# Patient Record
Sex: Female | Born: 1985 | Race: White | Hispanic: No | Marital: Single | Smoking: Never smoker
Health system: Southern US, Community
[De-identification: ages and names within clinical notes are randomized; demographics above are authoritative.]

---

## 2011-08-09 ENCOUNTER — Encounter (HOSPITAL_BASED_OUTPATIENT_CLINIC_OR_DEPARTMENT_OTHER): Payer: Self-pay

## 2011-08-09 ENCOUNTER — Other Ambulatory Visit: Payer: Self-pay

## 2011-08-09 ENCOUNTER — Emergency Department (INDEPENDENT_AMBULATORY_CARE_PROVIDER_SITE_OTHER): Payer: 59

## 2011-08-09 ENCOUNTER — Emergency Department (HOSPITAL_BASED_OUTPATIENT_CLINIC_OR_DEPARTMENT_OTHER)
Admission: EM | Admit: 2011-08-09 | Discharge: 2011-08-09 | Disposition: A | Discharge status: Home / Self Care | Payer: 59 | Attending: Emergency Medicine | Admitting: Emergency Medicine

## 2011-08-09 DIAGNOSIS — R071 Chest pain on breathing: Secondary | ICD-10-CM | POA: Insufficient documentation

## 2011-08-09 DIAGNOSIS — R079 Chest pain, unspecified: Secondary | ICD-10-CM | POA: Insufficient documentation

## 2011-08-09 DIAGNOSIS — R0789 Other chest pain: Secondary | ICD-10-CM

## 2011-08-09 LAB — CBC
MCV: 76.5 fL — ABNORMAL LOW (ref 78.0–100.0)
Platelets: 318 10*3/uL (ref 150–400)
RBC: 4.55 MIL/uL (ref 3.87–5.11)
RDW: 13.5 % (ref 11.5–15.5)
WBC: 4.9 10*3/uL (ref 4.0–10.5)

## 2011-08-09 LAB — BASIC METABOLIC PANEL
CO2: 23 mEq/L (ref 19–32)
Chloride: 104 mEq/L (ref 96–112)
Creatinine, Ser: 0.7 mg/dL (ref 0.50–1.10)
GFR calc Af Amer: >90 mL/min (ref >90)
Potassium: 3.9 mEq/L (ref 3.5–5.1)
Sodium: 138 mEq/L (ref 135–145)

## 2011-08-09 LAB — CARDIAC PANEL(CRET KIN+CKTOT+MB+TROPI)
CK, MB: 3 ng/mL (ref 0.3–4.0)
Relative Index: 0.8 (ref 0.0–2.5)
Total CK: 396 U/L — ABNORMAL HIGH (ref 7–177)

## 2011-08-09 LAB — D-DIMER, QUANTITATIVE: D-Dimer, Quant: <0.22 ug/mL-FEU (ref 0.00–0.48)

## 2011-08-09 MED ORDER — IBUPROFEN 800 MG PO TABS: 800.0000 mg | ORAL_TABLET | Freq: Three times a day (TID) | ORAL | Status: AC

## 2011-08-09 MED ORDER — IBUPROFEN 800 MG PO TABS
800.0000 mg | ORAL_TABLET | Freq: Once | ORAL | Status: AC
  Administered 2011-08-09: 800 mg via ORAL
  Filled 2011-08-09: qty 1

## 2011-08-09 NOTE — ED Provider Notes (Signed)
History     CSN: [id number]  Arrival date & time 08/09/11  2036   First MD Initiated Contact with Patient 08/09/11 2125      Chief Complaint  Patient presents with  . Arm Pain    (Consider location/radiation/quality/duration/timing/severity/associated sxs/prior treatment) Patient is a 26 y.o. female presenting with chest pain. The history is provided by the patient. No language interpreter was used.  Chest Pain The chest pain began 3 - 5 hours ago. Chest pain occurs constantly. The chest pain is improving. The pain is associated with exertion. At its most intense, the pain is at 8/10. The pain is currently at 2/10. The severity of the pain is moderate. The quality of the pain is described as aching. The pain radiates to the left arm. Chest pain is worsened by certain positions and exertion. Pertinent negatives for primary symptoms include no fever, no fatigue, no syncope, no shortness of breath, no cough, no wheezing and no nausea.  Pertinent negatives for associated symptoms include no claudication, no lower extremity edema and no numbness. She tried nothing for the symptoms. Risk factors include obesity.  Pertinent negatives for past medical history include no CAD, no hyperlipidemia and no hypertension.  Her family medical history is significant for heart disease in family.   Pt reports she did a different exercise earlier in the week.  Pt reports she was shopping today and began having pain  History reviewed. No pertinent past medical history.  History reviewed. No pertinent past surgical history.  History reviewed. No pertinent family history.  History  Substance Use Topics  . Smoking status: Never Smoker   . Smokeless tobacco: Never Used  . Alcohol Use: Yes     socially    OB History    Grav Para Term Preterm Abortions TAB SAB Ect Mult Living                  Review of Systems  Unable to perform ROS Constitutional: Negative for fever and fatigue.  Respiratory:  Negative for cough, shortness of breath and wheezing.   Cardiovascular: Positive for chest pain. Negative for claudication and syncope.  Gastrointestinal: Negative for nausea.  Neurological: Negative for numbness.  All other systems reviewed and are negative.    Allergies  Review of patient's allergies indicates no known allergies.  Home Medications   Current Outpatient Rx  Name Route Sig Dispense Refill  . ASPIRIN-ACETAMINOPHEN-CAFFEINE 250-250-65 MG PO TABS Oral Take 2 tablets by mouth once as needed. For headache    . ADULT MULTIVITAMIN W/MINERALS CH Oral Take 2 tablets by mouth daily.    Marland Kitchen OVER THE COUNTER MEDICATION Oral Take 1 tablet by mouth 3 (three) times daily. 150 mg raspberry ketone supplement      BP 136/92  Pulse 77  Temp(Src) 97.8 F (36.6 C) (Oral)  Resp 16  Ht 5\' 7"  (1.702 m)  Wt 330 lb (149.687 kg)  BMI 51.69 kg/m2  SpO2 100%  LMP 07/13/2011  Physical Exam  Nursing note and vitals reviewed. Constitutional: She appears well-developed and well-nourished.  HENT:  Head: Normocephalic and atraumatic.  Nose: Nose normal.  Mouth/Throat: Oropharynx is clear and moist.  Eyes: Pupils are equal, round, and reactive to light.  Neck: Normal range of motion.  Cardiovascular: Normal rate and normal heart sounds.   Pulmonary/Chest: Effort normal and breath sounds normal.       Pain to palp left chest.  Pt able to isolate pain to left breast tissue.  Abdominal: Soft.  Musculoskeletal: Normal range of motion.  Neurological: She is alert.  Skin: Skin is warm.  Psychiatric: She has a normal mood and affect.    ED Course  Procedures (including critical care time)  Labs Reviewed - No data to display No results found.   No diagnosis found.    MDM   Date: 08/09/2011  Rate: 72  Rhythm: normal sinus rhythm  QRS Axis: normal  Intervals: normal  ST/T Wave abnormalities: normal  Conduction Disutrbances:none  Narrative Interpretation:   Old EKG Reviewed: none  available  Results for orders placed during the hospital encounter of 08/09/11  CBC      Component Value Range   WBC 4.9  4.0 - 10.5 (K/uL)   RBC 4.55  3.87 - 5.11 (MIL/uL)   Hemoglobin 11.7 (*) 12.0 - 15.0 (g/dL)   HCT 16.1 (*) 09.6 - 46.0 (%)   MCV 76.5 (*) 78.0 - 100.0 (fL)   MCH 25.7 (*) 26.0 - 34.0 (pg)   MCHC 33.6  30.0 - 36.0 (g/dL)   RDW 04.5  40.9 - 81.1 (%)   Platelets 318  150 - 400 (K/uL)  BASIC METABOLIC PANEL      Component Value Range   Sodium 138  135 - 145 (mEq/L)   Potassium 3.9  3.5 - 5.1 (mEq/L)   Chloride 104  96 - 112 (mEq/L)   CO2 23  19 - 32 (mEq/L)   Glucose, Bld 109 (*) 70 - 99 (mg/dL)   BUN 15  6 - 23 (mg/dL)   Creatinine, Ser 9.14  0.50 - 1.10 (mg/dL)   Calcium 9.7  8.4 - 78.2 (mg/dL)   GFR calc non Af Amer >90  >90 (mL/min)   GFR calc Af Amer >90  >90 (mL/min)  CARDIAC PANEL(CRET KIN+CKTOT+MB+TROPI)      Component Value Range   Total CK 396 (*) 7 - 177 (U/L)   CK, MB 3.0  0.3 - 4.0 (ng/mL)   Troponin I <0.30  <0.30 (ng/mL)   Relative Index 0.8  0.0 - 2.5   D-DIMER, QUANTITATIVE      Component Value Range   D-Dimer, Quant <0.22  0.00 - 0.48 (ug/mL-FEU)   Dg Chest 2 View  08/09/2011  *RADIOLOGY REPORT*  Clinical Data: Left-sided chest pain for 3 hours.  CHEST - 2 VIEW  Comparison: None.  Findings: Normal heart size and pulmonary vascularity.  The lungs appear clear expanded.  No focal airspace consolidation.  No blunting of costophrenic angles.  No pneumothorax.  Incidental note of a curvilinear calcification in the right humeral head which probably represents a bone cyst.  IMPRESSION: No evidence of active pulmonary disease.  Original Report Authenticated By: Marlon Pel, M.D.       ED course.  Pt comfortable,   I advised of results.  I think pain is muscular.  I advised see primary Md for recheck tomorrow.  Ibuprofen 800mg  for pain.   Return if symptoms worsen or change.  Pt advised of need for recheck of blood pressure.   Langston Masker,  [place] 08/09/11 2325

## 2011-08-09 NOTE — ED Provider Notes (Signed)
Medical screening examination/treatment/procedure(s) were conducted as a shared visit with non-physician practitioner(s) and myself.  I personally evaluated the patient during the encounter  RRR, no m/r/g. Denies h/o VTE in self but grandfather with VTE unk cause. No recent hosp/surg/immob. No h/o cancer. Denies exogenous hormone use, no leg pain or swelling. D dimer negative here. Min nonspecific diffuse STE on ecg. Possible pericarditis v/s msk. Myocarditis far less likely. Ibuprofen, PMD f/u   Forbes Cellar, MD 08/09/11 2342

## 2011-08-09 NOTE — ED Notes (Signed)
Pt presnts to ED today with left sided chest pain that radiated down left arm.  No neck or jaw pain.  Pt rates on scale 8/10.  Pt had been shopping all day and stated pain started when she got home this pm.  Pt has no hx of same.  Family hx + for cardiac.

## 2011-08-09 NOTE — ED Notes (Signed)
Pt states that she has severe L arm pain radiating from her fingers to her L breast.  Pt denies cardiac history.  Pt states that she has not been nausea, vomiting, diaphoretic.

## 2012-12-07 ENCOUNTER — Other Ambulatory Visit: Payer: Self-pay

## 2012-12-07 ENCOUNTER — Emergency Department (HOSPITAL_BASED_OUTPATIENT_CLINIC_OR_DEPARTMENT_OTHER)
Admission: EM | Admit: 2012-12-07 | Discharge: 2012-12-07 | Disposition: A | Discharge status: Home / Self Care | Payer: BC Managed Care – PPO | Attending: Emergency Medicine | Admitting: Emergency Medicine

## 2012-12-07 ENCOUNTER — Encounter (HOSPITAL_BASED_OUTPATIENT_CLINIC_OR_DEPARTMENT_OTHER): Payer: Self-pay | Admitting: *Deleted

## 2012-12-07 ENCOUNTER — Emergency Department (HOSPITAL_BASED_OUTPATIENT_CLINIC_OR_DEPARTMENT_OTHER): Payer: BC Managed Care – PPO

## 2012-12-07 DIAGNOSIS — R0789 Other chest pain: Secondary | ICD-10-CM | POA: Insufficient documentation

## 2012-12-07 MED ORDER — ALBUTEROL SULFATE HFA 108 (90 BASE) MCG/ACT IN AERS
2.0000 | INHALATION_SPRAY | RESPIRATORY_TRACT | Status: DC | PRN
  Administered 2012-12-07: 2 via RESPIRATORY_TRACT
  Filled 2012-12-07: qty 6.7

## 2012-12-07 NOTE — ED Notes (Signed)
Pt amb to room 10 with quick steady gait in nad. Pt reports intermittent chest pains x Sunday, chest is tender to touch, pain increases with inspiration. Denies any sob, nausea, diaphoresis or other c/o.

## 2012-12-07 NOTE — ED Provider Notes (Signed)
History     CSN: [id number]  Arrival date & time 12/07/12  4098   First MD Initiated Contact with Patient 12/07/12 [phone]      Chief Complaint  Patient presents with  . Pleurisy    (Consider location/radiation/quality/duration/timing/severity/associated sxs/prior treatment) HPI Pt presenting with c/o chest pain and sensation of tightness in her chest.  Symptoms have been ongoing over the past 3 days.  No difficulty breathing.  No sob, no nausea, no diaphoresis.  No leg swelling, no hx of DVT/PE, no recent travel/trauma/surgery.  She has not taken anything for her symptoms.  This morning she felt as if she was having a panic attack but this has resolved.  No nasal congestion or sore throat.  No cough.  There are no other associated systemic symptoms, there are no other alleviating or modifying factors.   History reviewed. No pertinent past medical history.  History reviewed. No pertinent past surgical history.  History reviewed. No pertinent family history.  History  Substance Use Topics  . Smoking status: Never Smoker   . Smokeless tobacco: Never Used  . Alcohol Use: Yes     Comment: socially    OB History   Grav Para Term Preterm Abortions TAB SAB Ect Mult Living                  Review of Systems ROS reviewed and all otherwise negative except for mentioned in HPI  Allergies  Review of patient's allergies indicates no known allergies.  Home Medications   Current Outpatient Rx  Name  Route  Sig  Dispense  Refill  . aspirin-acetaminophen-caffeine (EXCEDRIN MIGRAINE) 250-250-65 MG per tablet   Oral   Take 2 tablets by mouth once as needed. For headache         . Multiple Vitamin (MULITIVITAMIN WITH MINERALS) TABS   Oral   Take 2 tablets by mouth daily.         Marland Kitchen OVER THE COUNTER MEDICATION   Oral   Take 1 tablet by mouth 3 (three) times daily. 150 mg raspberry ketone supplement           BP 136/80  Pulse 79  Temp(Src) 97.9 F (36.6 C) (Oral)  Resp  18  Ht 5\' 7"  (1.702 m)  Wt 303 lb (137.44 kg)  BMI 47.45 kg/m2  SpO2 100%  LMP 11/28/2012 Vitals reviewed Physical Exam Physical Examination: General appearance - alert, well appearing, and in no distress Mental status - alert, oriented to person, place, and time Eyes - no scleral icterus, no conjunctival injection Mouth - mucous membranes moist, pharynx normal without lesions Chest - clear to auscultation, no wheezes, rales or rhonchi, symmetric air entry, no reproducible tenderness to palpation, no increased respiratory effort Heart - normal rate, regular rhythm, normal S1, S2, no murmurs, rubs, clicks or gallops Abdomen - soft, nontender, nondistended, no masses or organomegaly Extremities - peripheral pulses normal, no pedal edema, no clubbing or cyanosis Skin - normal coloration and turgor, no rashes  ED Course  Procedures (including critical care time)   Date: 12/07/2012  Rate: 70  Rhythm: normal sinus rhythm  QRS Axis: normal  Intervals: normal  ST/T Wave abnormalities: normal  Conduction Disutrbances: none  Narrative Interpretation: unremarkable     Labs Reviewed - No data to display Dg Chest 2 View  12/07/2012   *RADIOLOGY REPORT*  Clinical Data: Chest pain  CHEST - 2 VIEW  Comparison: None  Findings: The heart size and mediastinal contours are within normal  limits.  Both lungs are clear.  The visualized skeletal structures are unremarkable.  IMPRESSION: Negative examination.   Original Report Authenticated By: Signa Kell, M.D.     1. Feeling of chest tightness       MDM  Pt presenting with c/o chest tightness.  This morning her symptoms were associated with what she described as a panic attack.  EKG and CXR are normal, her exam is reassuring as well.  Low suspicion for ACS, PERC 0 so very low risk of PE.  Given albuterol MDI which may help with her symptoms, though no frank wheezing appreciated.  Discharged with strict return precautions.  Pt agreeable with  plan.        Ethelda Chick, MD 12/08/12 [phone]

## 2013-01-06 ENCOUNTER — Emergency Department (HOSPITAL_BASED_OUTPATIENT_CLINIC_OR_DEPARTMENT_OTHER)
Admission: EM | Admit: 2013-01-06 | Discharge: 2013-01-06 | Disposition: A | Discharge status: Home / Self Care | Payer: BC Managed Care – PPO | Attending: Emergency Medicine | Admitting: Emergency Medicine

## 2013-01-06 ENCOUNTER — Encounter (HOSPITAL_BASED_OUTPATIENT_CLINIC_OR_DEPARTMENT_OTHER): Payer: Self-pay | Admitting: Emergency Medicine

## 2013-01-06 DIAGNOSIS — Z79899 Other long term (current) drug therapy: Secondary | ICD-10-CM | POA: Insufficient documentation

## 2013-01-06 DIAGNOSIS — S8990XA Unspecified injury of unspecified lower leg, initial encounter: Secondary | ICD-10-CM | POA: Insufficient documentation

## 2013-01-06 DIAGNOSIS — S99929A Unspecified injury of unspecified foot, initial encounter: Secondary | ICD-10-CM | POA: Insufficient documentation

## 2013-01-06 DIAGNOSIS — M25562 Pain in left knee: Secondary | ICD-10-CM

## 2013-01-06 DIAGNOSIS — Y9389 Activity, other specified: Secondary | ICD-10-CM | POA: Insufficient documentation

## 2013-01-06 DIAGNOSIS — Y92410 Unspecified street and highway as the place of occurrence of the external cause: Secondary | ICD-10-CM | POA: Insufficient documentation

## 2013-01-06 DIAGNOSIS — IMO0002 Reserved for concepts with insufficient information to code with codable children: Secondary | ICD-10-CM | POA: Insufficient documentation

## 2013-01-06 MED ORDER — HYDROCODONE-ACETAMINOPHEN 5-325 MG PO TABS: 2.0000 | ORAL_TABLET | Freq: Four times a day (QID) | ORAL | Status: DC | PRN
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
## 2013-01-06 NOTE — ED Notes (Signed)
Pt ambulating independently w/ steady gait on d/c in no acute distress, A&Ox4. D/c instructions reviewed w/ pt and family - pt and family deny any further questions or concerns at present. Rx given x1, Pt instructed to not use alcohol, drive, or operate heavy machinery while take the prescription pain medications as they could make him drowsy - pt verbalized understanding.    

## 2013-01-11 NOTE — ED Provider Notes (Signed)
Medical screening examination/treatment/procedure(s) were performed by non-physician practitioner and as supervising physician I was immediately available for consultation/collaboration.   Carleene Cooper III, MD 01/11/13 [phone]

## 2014-04-06 IMAGING — CR DG CHEST 2V
2 series · 2 of 2 positions shown · non-contrast
Comparison: None

CLINICAL DATA: Chest pain

CHEST - 2 VIEW

[w chest pa]
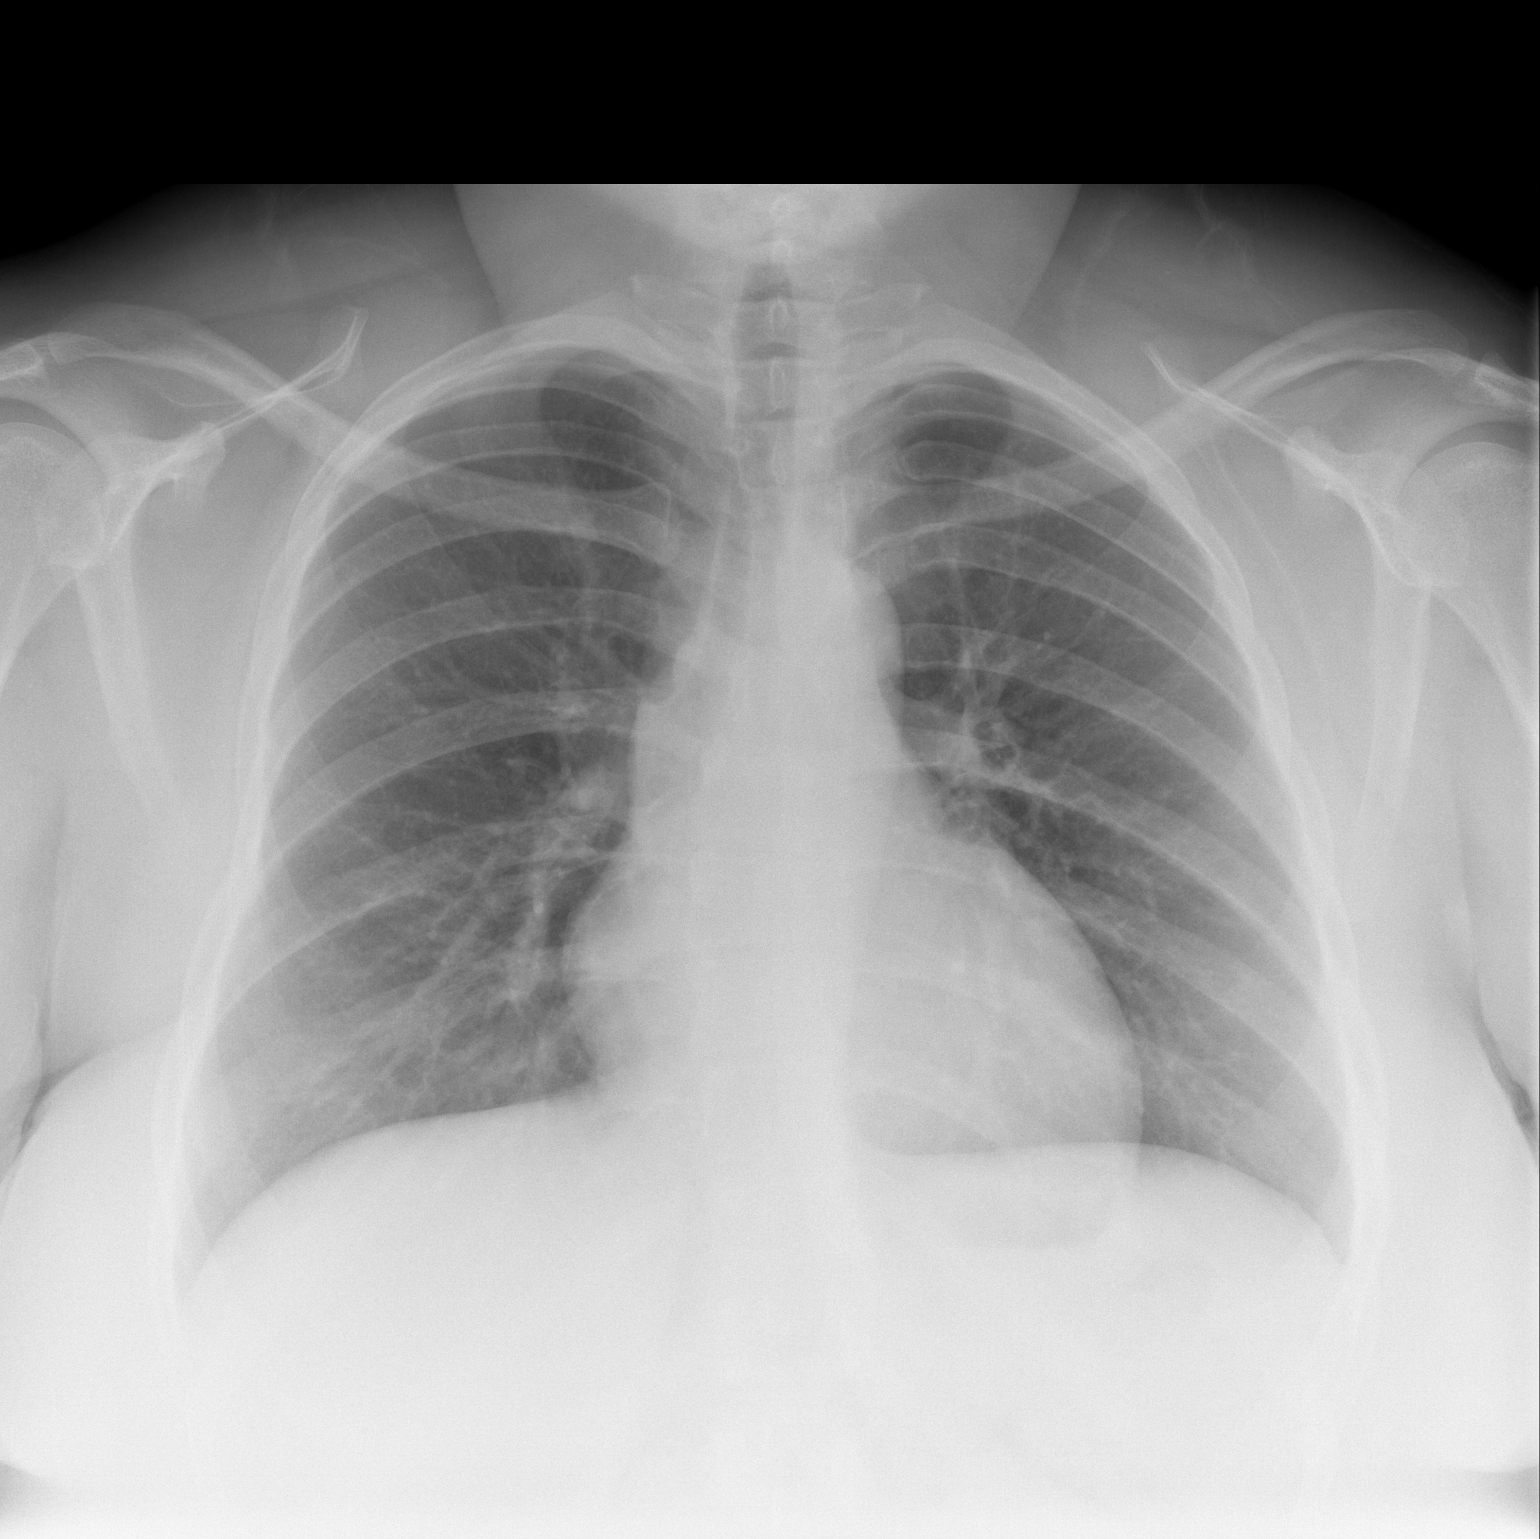

[w chest lat]
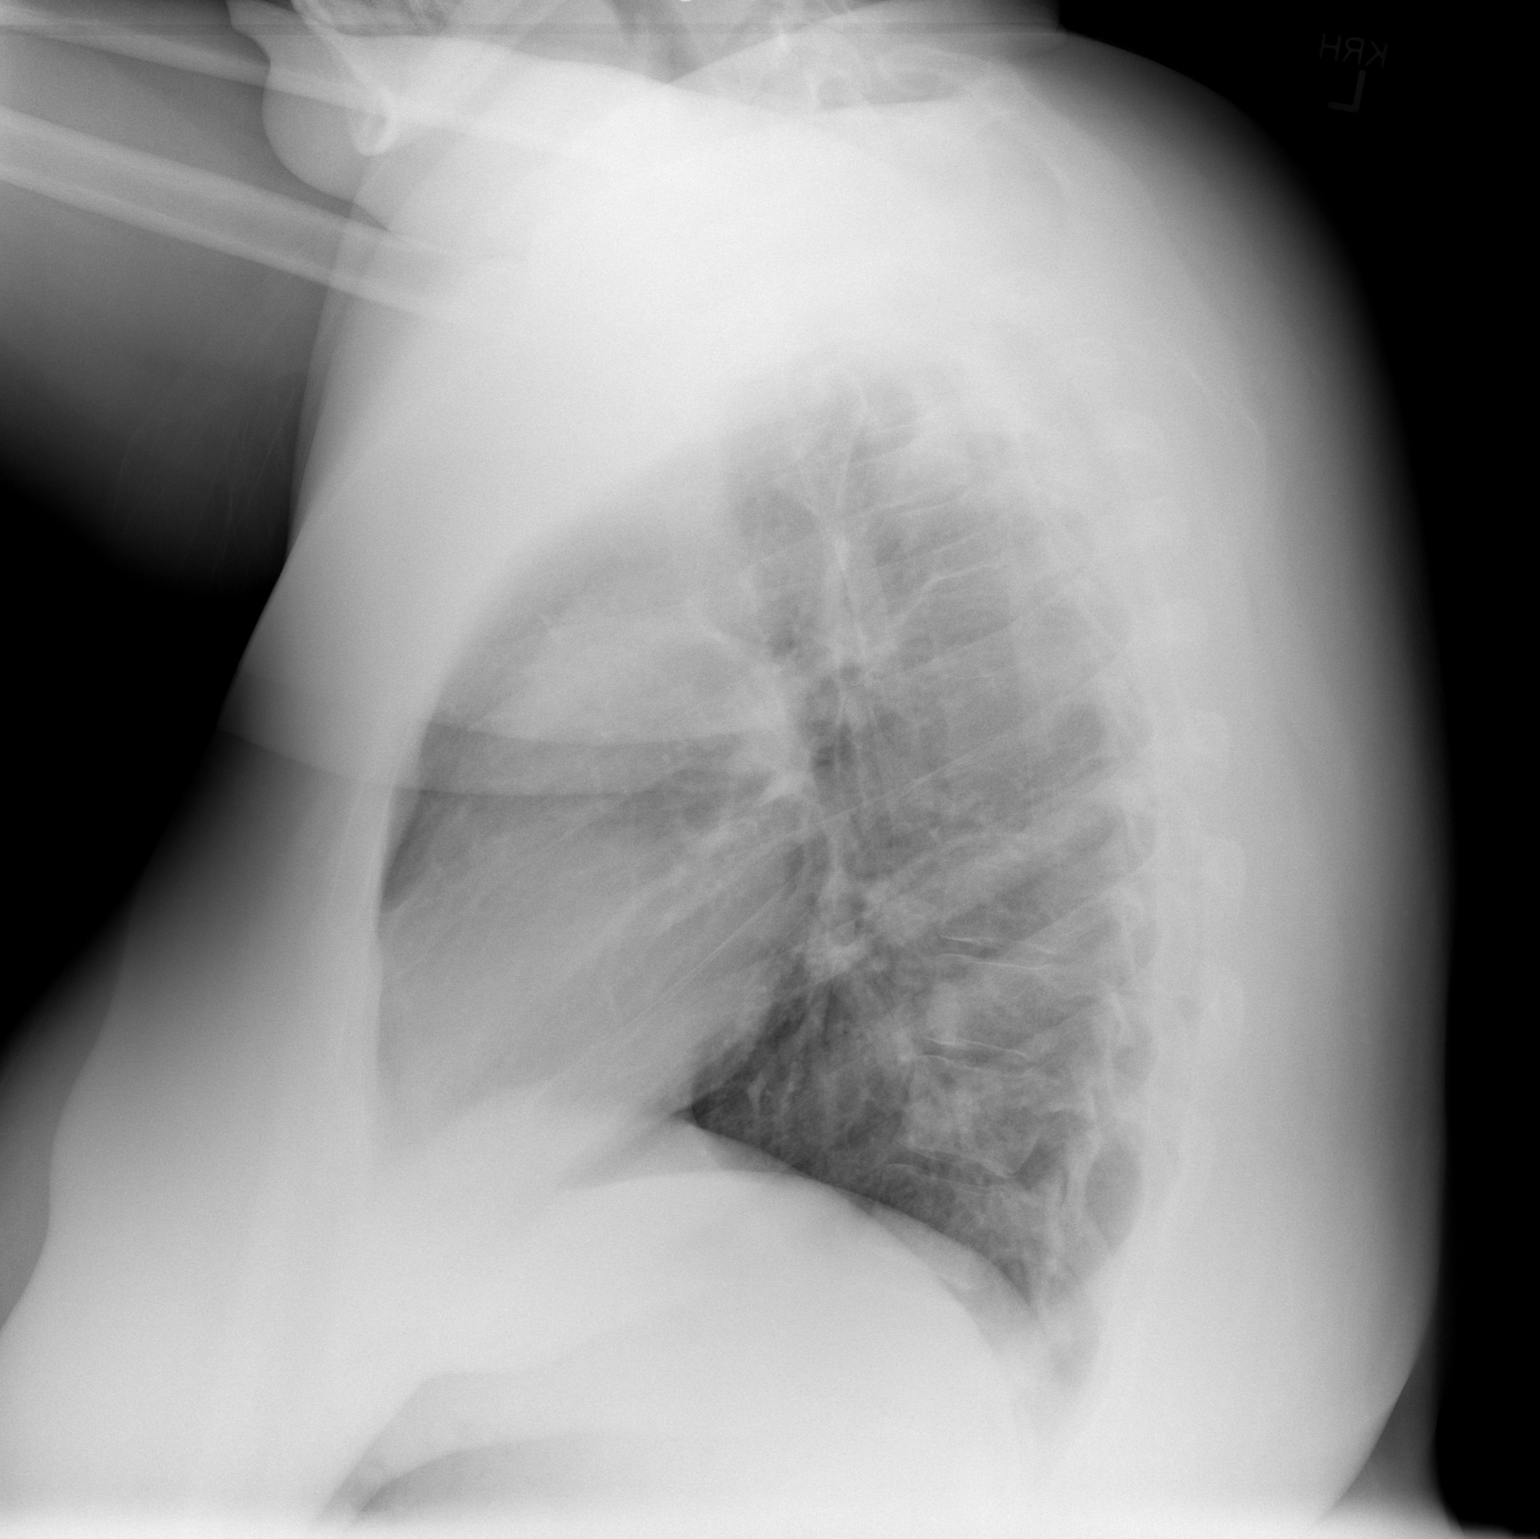

[2 of 2 positions shown; findings below may reference images not displayed]

FINDINGS: The heart size and mediastinal contours are within normal
limits.  Both lungs are clear.  The visualized skeletal structures
are unremarkable.
IMPRESSION: Negative examination.

## 2014-11-22 ENCOUNTER — Encounter (HOSPITAL_COMMUNITY): Payer: Self-pay | Admitting: Neurology

## 2014-11-22 ENCOUNTER — Emergency Department (HOSPITAL_COMMUNITY)
Admission: EM | Admit: 2014-11-22 | Discharge: 2014-11-22 | Disposition: A | Discharge status: Home / Self Care | Payer: Worker's Compensation | Attending: Emergency Medicine | Admitting: Emergency Medicine

## 2014-11-22 DIAGNOSIS — Y289XXA Contact with unspecified sharp object, undetermined intent, initial encounter: Secondary | ICD-10-CM | POA: Diagnosis not present

## 2014-11-22 DIAGNOSIS — S61111A Laceration without foreign body of right thumb with damage to nail, initial encounter: Secondary | ICD-10-CM | POA: Diagnosis present

## 2014-11-22 DIAGNOSIS — Y990 Civilian activity done for income or pay: Secondary | ICD-10-CM | POA: Insufficient documentation

## 2014-11-22 DIAGNOSIS — Y929 Unspecified place or not applicable: Secondary | ICD-10-CM | POA: Diagnosis not present

## 2014-11-22 DIAGNOSIS — Y9389 Activity, other specified: Secondary | ICD-10-CM | POA: Diagnosis not present

## 2014-11-22 DIAGNOSIS — S61319A Laceration without foreign body of unspecified finger with damage to nail, initial encounter: Secondary | ICD-10-CM

## 2014-11-22 MED ORDER — HYDROCODONE-ACETAMINOPHEN 5-325 MG PO TABS: 1.0000 | ORAL_TABLET | Freq: Four times a day (QID) | ORAL | Status: AC | PRN

## 2014-11-22 MED ORDER — BUPIVACAINE HCL (PF) 0.5 % IJ SOLN
20.0000 mL | Freq: Once | INTRAMUSCULAR | Status: AC
  Administered 2014-11-22: 20 mL
  Filled 2014-11-22: qty 20

## 2014-11-22 NOTE — ED Notes (Signed)
Pt stable, ambulatory, states understanding of discharge instructions 

## 2014-11-22 NOTE — ED Notes (Signed)
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 Was seen at OT, but sent here for further evaluation. Given tetanus already.

## 2014-11-22 NOTE — Discharge Instructions (Signed)
Nail Avulsion Injury Nail avulsion means that you have lost the whole, or part of a nail. The nail will usually grow back in 2 to 6 months. If your injury damaged the growth center of the nail, the nail may be deformed, split, or not stuck to the nail bed. Sometimes the avulsed nail is stitched back in place. This provides temporary protection to the nail bed until the new nail grows in.  HOME CARE INSTRUCTIONS   Raise (elevate) your injury as much as possible.  Protect the injury and cover it with bandages (dressings) or splints as instructed.  Change dressings as instructed. SEEK MEDICAL CARE IF:   There is increasing pain, redness, or swelling.  You cannot move your fingers or toes. Document Released: 07/23/2004 Document Revised: 09/07/2011 Document Reviewed: 05/17/2009 [facility] Patient Information 2015 [place], [place]. This information is not intended to replace advice given to you by your health care provider. Make sure you discuss any questions you have with your health care provider.  Sutured Wound Care Sutures are stitches that can be used to close wounds. Wound care helps prevent pain and infection.  HOME CARE INSTRUCTIONS   Rest and elevate the injured area until all the pain and swelling are gone.  Only take over-the-counter or prescription medicines for pain, discomfort, or fever as directed by your caregiver.  After 48 hours, gently wash the area with mild soap and water once a day, or as directed. Rinse off the soap. Pat the area dry with a clean towel. Do not rub the wound. This may cause bleeding.  Follow your caregiver's instructions for how often to change the bandage (dressing). Stop using a dressing after 2 days or after the wound stops draining.  If the dressing sticks, moisten it with soapy water and gently remove it.  Apply ointment on the wound as directed.  Avoid stretching a sutured wound.  Drink enough fluids to keep your urine clear or pale  yellow.  Follow up with your caregiver for suture removal as directed.  Use sunscreen on your wound for the next 3 to 6 months so the scar will not darken. SEEK IMMEDIATE MEDICAL CARE IF:   Your wound becomes red, swollen, hot, or tender.  You have increasing pain in the wound.  You have a red streak that extends from the wound.  There is pus coming from the wound.  You have a fever.  You have shaking chills.  There is a bad smell coming from the wound.  You have persistent bleeding from the wound. MAKE SURE YOU:   Understand these instructions.  Will watch your condition.  Will get help right away if you are not doing well or get worse. Document Released: 07/23/2004 Document Revised: 09/07/2011 Document Reviewed: 10/19/2010 [facility] Patient Information 2015 [place], [place]. This information is not intended to replace advice given to you by your health care provider. Make sure you discuss any questions you have with your health care provider.

## 2014-11-22 NOTE — ED Provider Notes (Signed)
CSN: [id number]     Arrival date & time 11/22/14  1512 History  This chart was scribed for non-physician practitioner, Jinny Sanders, PA-C working with Arby Barrette, MD, by Abel Presto, ED Scribe. This patient was seen in room TR10C/TR10C and the patient's care was started at 4:29 PM.       Chief Complaint  Patient presents with  . Extremity Laceration     The history is provided by the patient. No language interpreter was used.   HPI Comments: Belinda Marks is a 29 y.o. female who presents to the Emergency Department complaining of laceration to right thumb with onset around 1:30 PM. 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 Pt was [facility] prior to arrival to ED. Pt was given a tetanus shot there. Pt has NKDA. Pt denies numbness and weakness.   History reviewed. No pertinent past medical history. History reviewed. No pertinent past surgical history. No family history on file. History  Substance Use Topics  . Smoking status: Never Smoker   . Smokeless tobacco: Never Used  . Alcohol Use: Yes     Comment: socially   OB History    No data available     Review of Systems  Skin: Positive for wound.  Neurological: Negative for weakness and numbness.      Allergies  Review of patient's allergies indicates no known allergies.  Home Medications   Prior to Admission medications   Medication Sig Start Date End Date Taking? Authorizing Provider  aspirin-acetaminophen-caffeine (EXCEDRIN MIGRAINE) [phone] MG per tablet Take 2 tablets by mouth once as needed. For headache    Historical Provider, MD  HYDROcodone-acetaminophen (NORCO/VICODIN) 5-325 MG per tablet Take 1-2 tablets by mouth every 6 (six) hours as needed. 11/22/14   Ladona Mow, PA-C  Multiple Vitamin (MULITIVITAMIN WITH MINERALS) TABS Take 2 tablets by mouth daily.    Historical Provider, MD  OVER THE COUNTER MEDICATION Take 1 tablet by mouth 3 (three) times  daily. 150 mg raspberry ketone supplement    Historical Provider, MD   BP 151/115 mmHg  Pulse 72  Temp(Src) 98.4 F (36.9 C) (Oral)  Resp 18  SpO2 100%  LMP 11/08/2014 Physical Exam  Constitutional: She is oriented to person, place, and time. She appears well-developed and well-nourished.  HENT:  Head: Normocephalic.  Eyes: Conjunctivae are normal.  Neck: Normal range of motion. Neck supple.  Pulmonary/Chest: Effort normal.  Musculoskeletal: Normal range of motion.  Neurological: She is alert and oriented to person, place, and time.  Skin: Skin is warm and dry.  2-3 cm laceration extending from the radial eponychial fold on the thumb of the right hand midway through the nail, extending to the distal portion of the nail, through the radial aspect of the finger pad Wrote refill less than 2 seconds. Full range of motion of thumb. Laceration is superficial, does extend into the nailbed.   Psychiatric: She has a normal mood and affect. Her behavior is normal.  Nursing note and vitals reviewed.          LACERATION REPAIR PROCEDURE NOTE The patient's identification was confirmed and consent was obtained. This procedure was performed by Jinny Sanders, PA-C at 5:04 PM. Site: right thumb Sterile procedures observed Anesthetic used (type and amt): bupivacaine 0.5% Suture type/size:5-0 Prolene (5) , 4-0 Vicryl Rapide (2) Length: 3 cm # of Sutures: 7 Technique and complexity: simple interrupted Antibx ointment applied Tetanus UTD or ordered: tetanus given PTA  Site anesthetized, irrigated with NS, explored without evidence of foreign body, wound well approximated, site covered with dry, sterile dressing.  Patient tolerated procedure well without complications. Instructions for care discussed verbally and patient provided with additional written instructions for homecare and f/u.   ED Course  Procedures (including critical care time) DIAGNOSTIC STUDIES: Oxygen Saturation is 100%  on room air, normal by my interpretation.    COORDINATION OF CARE: 4:34 PM Discussed treatment plan with patient at beside, the patient agrees with the plan and has no further questions at this time.   Labs Review Labs Reviewed - No data to display  Imaging Review No results found.   EKG Interpretation None      MDM   Final diagnoses:  Nailbed laceration, finger, initial encounter   Tdap booster updated. Wound cleaning complete with pressure irrigation, bottom of wound visualized, no foreign bodies appreciated. Laceration occurred < 8 hours prior to repair which was well tolerated. Pt has no co morbidities to effect normal wound healing. Discussed suture home care w pt and answered questions. Pt to f-u for wound check and suture removal in 7 days. Pt is hemodynamically stable w no complaints prior to dc. Return precautions discussed, patient strongly encouraged to follow-up with hand surgery. Patient verbalizes understanding and agreement of this plan.  I personally performed the services described in this documentation, which was scribed in my presence. The recorded information has been reviewed and is accurate.  BP 151/115 mmHg  Pulse 72  Temp(Src) 98.4 F (36.9 C) (Oral)  Resp 18  SpO2 100%  LMP 11/08/2014  Signed,  Ladona MowJoe Vern Prestia, PA-C 12:26 AM  Patient seen and discussed with Dr. Arby BarretteMarcy Pfeiffer, M.D.   Ladona MowJoe Remy Voiles, PA-C 11/23/14 [phone]  Arby BarretteMarcy Pfeiffer, MD 11/23/14 [phone]

## 2014-11-28 ENCOUNTER — Encounter (HOSPITAL_COMMUNITY): Payer: Self-pay | Admitting: *Deleted

## 2014-11-28 ENCOUNTER — Emergency Department (HOSPITAL_COMMUNITY)
Admission: EM | Admit: 2014-11-28 | Discharge: 2014-11-28 | Disposition: A | Discharge status: Home / Self Care | Payer: Self-pay | Attending: Emergency Medicine | Admitting: Emergency Medicine

## 2014-11-28 DIAGNOSIS — Z4802 Encounter for removal of sutures: Secondary | ICD-10-CM | POA: Insufficient documentation

## 2014-11-28 NOTE — ED Notes (Signed)
Pt here two days early for suture removal to thumb.

## 2014-11-28 NOTE — ED Notes (Signed)
Pt requesting stiches to be removed from thumb.

## 2014-11-28 NOTE — ED Provider Notes (Signed)
CSN: [id number]     Arrival date & time 11/28/14  2159 History  This chart was scribed for non-physician practitioner, Celene Skeen, working with Mancel Bale, MD by Richarda Overlie, ED Scribe. This patient was seen in room TR06C/TR06C and the patient's care was started at 10:10 PM.   Chief Complaint  Patient presents with  . Suture / Staple Removal   The history is provided by the patient. No language interpreter was used.   HPI Comments: Belinda Marks is a 29 y.o. female who presents to the Emergency Department for a suture removal on her right thumb. Pt states that she has not had any problems with the suture site. 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 Pt was given a tetanus shot at that visit. She reports no symptoms at this time. Pt denies fever or drainage.   History reviewed. No pertinent past medical history. History reviewed. No pertinent past surgical history. No family history on file. History  Substance Use Topics  . Smoking status: Never Smoker   . Smokeless tobacco: Never Used  . Alcohol Use: Yes     Comment: socially   OB History    No data available     Review of Systems  Constitutional: Negative for fever.  Skin: Wound:  Healing.      Allergies  Review of patient's allergies indicates no known allergies.  Home Medications   Prior to Admission medications   Medication Sig Start Date End Date Taking? Authorizing Provider  aspirin-acetaminophen-caffeine (EXCEDRIN MIGRAINE) [phone] MG per tablet Take 2 tablets by mouth once as needed. For headache    Historical Provider, MD  HYDROcodone-acetaminophen (NORCO/VICODIN) 5-325 MG per tablet Take 1-2 tablets by mouth every 6 (six) hours as needed. 11/22/14   Ladona Mow, PA-C  Multiple Vitamin (MULITIVITAMIN WITH MINERALS) TABS Take 2 tablets by mouth daily.    Historical Provider, MD  OVER THE COUNTER MEDICATION Take 1 tablet  by mouth 3 (three) times daily. 150 mg raspberry ketone supplement    Historical Provider, MD   BP 140/97 mmHg  Pulse 89  Temp(Src) 97.5 F (36.4 C) (Oral)  Resp 18  Ht  (1.702 m)  Wt 286 lb (129.729 kg)  BMI 44.78 kg/m2  SpO2 98%  LMP 11/08/2014   Physical Exam  Constitutional: She is oriented to person, place, and time. She appears well-developed and well-nourished. No distress.  HENT:  Head: Normocephalic and atraumatic.  Mouth/Throat: Oropharynx is clear and moist.  Eyes: Conjunctivae and EOM are normal.  Neck: Normal range of motion. Neck supple.  Cardiovascular: Normal rate, regular rhythm and normal heart sounds.   Pulmonary/Chest: Effort normal and breath sounds normal. No respiratory distress.  Musculoskeletal: Normal range of motion. She exhibits no edema.  Neurological: She is alert and oriented to person, place, and time. No sensory deficit.  Skin: Skin is warm and dry.  Well-healing curvilinear laceration of right thumb. 2 absorbable sutures in place over nail. 5 sutures in place on distal aspect of thumb. No drainage, erythema or edema. Appears to be healing well.  Psychiatric: She has a normal mood and affect. Her behavior is normal.  Nursing note and vitals reviewed.   ED Course  Procedures  SUTURE REMOVAL Performed by: Celene Skeen  Consent: Verbal consent obtained. Patient identity confirmed: provided demographic data Time out: Immediately prior to procedure a "time out" was called to verify  the correct patient, procedure, equipment, support staff and site/side marked as required.  Location details: right thumb  Wound Appearance: clean  Sutures/Staples Removed: 5  Facility: sutures placed in this facility Patient tolerance: Patient tolerated the procedure well with no immediate complications.    DIAGNOSTIC STUDIES: Oxygen Saturation is 98% on RA, normal by my interpretation.    COORDINATION OF CARE: 10:13 PM Discussed treatment plan with pt  at bedside and pt agreed to plan.    Labs Review Labs Reviewed - No data to display  Imaging Review No results found.   EKG Interpretation None      MDM   Final diagnoses:  Visit for suture removal   5 sutures removed from right thumb. 2 absorbable sutures still in place. I advised her to follow-up with her PCP if these are still present in 5 days. If that is attributed wound care given. Stable for discharge.able for d/c. Return precautions given. Patient states understanding of treatment care plan and is agreeable.  I personally performed the services described in this documentation, which was scribed in my presence. The recorded information has been reviewed and is accurate.  Kathrynn SpeedRobyn M Saray Capasso, PA-C 11/28/14 2218  Mancel BaleElliott Wentz, MD 11/29/14 [phone]

## 2014-11-28 NOTE — Discharge Instructions (Signed)

## 2015-12-26 ENCOUNTER — Encounter (HOSPITAL_BASED_OUTPATIENT_CLINIC_OR_DEPARTMENT_OTHER): Payer: Self-pay | Admitting: *Deleted

## 2015-12-26 ENCOUNTER — Emergency Department (HOSPITAL_BASED_OUTPATIENT_CLINIC_OR_DEPARTMENT_OTHER): Payer: Self-pay

## 2015-12-26 ENCOUNTER — Emergency Department (HOSPITAL_BASED_OUTPATIENT_CLINIC_OR_DEPARTMENT_OTHER)
Admission: EM | Admit: 2015-12-26 | Discharge: 2015-12-26 | Disposition: A | Discharge status: Home / Self Care | Payer: Self-pay | Attending: Emergency Medicine | Admitting: Emergency Medicine

## 2015-12-26 DIAGNOSIS — R0789 Other chest pain: Secondary | ICD-10-CM | POA: Insufficient documentation

## 2015-12-26 DIAGNOSIS — Z79899 Other long term (current) drug therapy: Secondary | ICD-10-CM | POA: Insufficient documentation

## 2015-12-26 LAB — CBC WITH DIFFERENTIAL/PLATELET
BASOS PCT: 1 %
Basophils Absolute: 0 10*3/uL (ref 0.0–0.1)
EOS ABS: 0.1 10*3/uL (ref 0.0–0.7)
EOS PCT: 2 %
HCT: 36.4 % (ref 36.0–46.0)
HEMOGLOBIN: 12.3 g/dL (ref 12.0–15.0)
LYMPHS ABS: 2.6 10*3/uL (ref 0.7–4.0)
Lymphocytes Relative: 40 %
MCH: 26.3 pg (ref 26.0–34.0)
MCHC: 33.8 g/dL (ref 30.0–36.0)
MCV: 77.8 fL — ABNORMAL LOW (ref 78.0–100.0)
MONOS PCT: 7 %
Monocytes Absolute: 0.5 10*3/uL (ref 0.1–1.0)
NEUTROS PCT: 50 %
Neutro Abs: 3.3 10*3/uL (ref 1.7–7.7)
PLATELETS: 362 10*3/uL (ref 150–400)
RBC: 4.68 MIL/uL (ref 3.87–5.11)
RDW: 12.6 % (ref 11.5–15.5)
WBC: 6.6 10*3/uL (ref 4.0–10.5)

## 2015-12-26 LAB — COMPREHENSIVE METABOLIC PANEL
ALK PHOS: 66 U/L (ref 38–126)
ALT: 21 U/L (ref 14–54)
AST: 23 U/L (ref 15–41)
Albumin: 3.8 g/dL (ref 3.5–5.0)
Anion gap: 8 (ref 5–15)
BUN: 13 mg/dL (ref 6–20)
CALCIUM: 9.1 mg/dL (ref 8.9–10.3)
CO2: 22 mmol/L (ref 22–32)
CREATININE: 0.72 mg/dL (ref 0.44–1.00)
Chloride: 107 mmol/L (ref 101–111)
Glucose, Bld: 98 mg/dL (ref 65–99)
Potassium: 3.6 mmol/L (ref 3.5–5.1)
Sodium: 137 mmol/L (ref 135–145)
Total Bilirubin: 0.2 mg/dL — ABNORMAL LOW (ref 0.3–1.2)
Total Protein: 7.1 g/dL (ref 6.5–8.1)

## 2015-12-26 LAB — URINE MICROSCOPIC-ADD ON

## 2015-12-26 LAB — D-DIMER, QUANTITATIVE (NOT AT ARMC): D DIMER QUANT: 0.34 ug{FEU}/mL (ref 0.00–0.50)

## 2015-12-26 LAB — URINALYSIS, ROUTINE W REFLEX MICROSCOPIC
BILIRUBIN URINE: NEGATIVE
GLUCOSE, UA: NEGATIVE mg/dL
KETONES UR: NEGATIVE mg/dL
LEUKOCYTES UA: NEGATIVE
Nitrite: NEGATIVE
PH: 6 (ref 5.0–8.0)
PROTEIN: NEGATIVE mg/dL
Specific Gravity, Urine: 1.016 (ref 1.005–1.030)

## 2015-12-26 LAB — PREGNANCY, URINE: PREG TEST UR: NEGATIVE

## 2015-12-26 LAB — LIPASE, BLOOD: Lipase: 30 U/L (ref 11–51)

## 2015-12-26 LAB — TROPONIN I: Troponin I: <0.03 ng/mL (ref <0.03)

## 2015-12-26 MED ORDER — GI COCKTAIL ~~LOC~~
30.0000 mL | Freq: Once | ORAL | Status: AC
  Administered 2015-12-26: 30 mL via ORAL
  Filled 2015-12-26: qty 30

## 2015-12-26 MED ORDER — FAMOTIDINE 20 MG PO TABS
20.0000 mg | ORAL_TABLET | Freq: Two times a day (BID) | ORAL | Status: AC
Start: 2015-12-26 — End: ?

## 2015-12-26 NOTE — ED Provider Notes (Signed)
CSN: [id number]     Arrival date & time 12/26/15  1842 History   First MD Initiated Contact with Patient 12/26/15 1849     Chief Complaint  Patient presents with  . Chest Pain  PT IS A 30 YO BF WITH A HX OF HTN WHO SAID THAT SHE DEVELOPED CP YESTERDAY.  PT SAID THAT IT HURTS ON THE LEFT SIDE OF HER CHEST.  SHE HAS NOT TAKEN ANYTHING FOR IT.   (Consider location/radiation/quality/duration/timing/severity/associated sxs/prior Treatment) Patient is a 30 y.o. female presenting with chest pain. The history is provided by the patient.  Chest Pain Pain location:  L chest Pain quality: dull   Pain radiates to:  Does not radiate Pain radiates to the back: no   Pain severity:  Mild Onset quality:  Sudden Timing:  Constant Progression:  Unchanged Chronicity:  New   History reviewed. No pertinent past medical history. History reviewed. No pertinent past surgical history. No family history on file. Social History  Substance Use Topics  . Smoking status: Never Smoker   . Smokeless tobacco: Never Used  . Alcohol Use: Yes     Comment: socially   OB History    No data available     Review of Systems  Cardiovascular: Positive for chest pain.  All other systems reviewed and are negative.     Allergies  Review of patient's allergies indicates no known allergies.  Home Medications   Prior to Admission medications   Medication Sig Start Date End Date Taking? Authorizing Provider  LOSARTAN POTASSIUM PO Take by mouth.   Yes Historical Provider, MD  aspirin-acetaminophen-caffeine (EXCEDRIN MIGRAINE) [phone] MG per tablet Take 2 tablets by mouth once as needed. For headache    Historical Provider, MD  famotidine (PEPCID) 20 MG tablet Take 1 tablet (20 mg total) by mouth 2 (two) times daily. 12/26/15   Jacalyn LefevreJulie Anzley Dibbern, MD  HYDROcodone-acetaminophen (NORCO/VICODIN) 5-325 MG per tablet Take 1-2 tablets by mouth every 6 (six) hours as needed. 11/22/14   Ladona MowJoe Mintz, PA-C  Multiple Vitamin  (MULITIVITAMIN WITH MINERALS) TABS Take 2 tablets by mouth daily.    Historical Provider, MD  OVER THE COUNTER MEDICATION Take 1 tablet by mouth 3 (three) times daily. 150 mg raspberry ketone supplement    Historical Provider, MD   BP 138/93 mmHg  Pulse 63  Temp(Src) 98 F (36.7 C) (Oral)  Resp 15  SpO2 100% Physical Exam  Constitutional: She is oriented to person, place, and time. She appears well-developed and well-nourished.  HENT:  Head: Normocephalic and atraumatic.  Right Ear: External ear normal.  Left Ear: External ear normal.  Nose: Nose normal.  Mouth/Throat: Oropharynx is clear and moist.  Eyes: Conjunctivae and EOM are normal. Pupils are equal, round, and reactive to light.  Neck: Normal range of motion. Neck supple.  Cardiovascular: Normal rate, regular rhythm, normal heart sounds and intact distal pulses.   Pulmonary/Chest: Effort normal and breath sounds normal.  Abdominal: Soft. Bowel sounds are normal.  Musculoskeletal: Normal range of motion.  Neurological: She is alert and oriented to person, place, and time.  Skin: Skin is warm and dry.  Psychiatric: She has a normal mood and affect. Her behavior is normal. Judgment and thought content normal.  Nursing note and vitals reviewed.   ED Course  Procedures (including critical care time) Labs Review Labs Reviewed  COMPREHENSIVE METABOLIC PANEL - Abnormal; Notable for the following:    Total Bilirubin 0.2 (*)    All other components within normal  limits  CBC WITH DIFFERENTIAL/PLATELET - Abnormal; Notable for the following:    MCV 77.8 (*)    All other components within normal limits  D-DIMER, QUANTITATIVE (NOT AT [facility])  TROPONIN I  LIPASE, BLOOD  PREGNANCY, URINE  URINALYSIS, ROUTINE W REFLEX MICROSCOPIC (NOT AT [facility])    Imaging Review Dg Chest 2 View  12/26/2015  CLINICAL DATA:  Left-sided chest pain and shortness of breath. EXAM: CHEST  2 VIEW COMPARISON:  December 07, 2012 FINDINGS: The heart size is  borderline. The hila, mediastinum, lungs, and pleura are otherwise unchanged and unremarkable. IMPRESSION: No active cardiopulmonary disease. Electronically Signed   By: Gerome Samavid  Williams III M.D   On: 12/26/2015 19:26   I have personally reviewed and evaluated these images and lab results as part of my medical decision-making.   EKG Interpretation   Date/Time:  Thursday December 26 2015 18:54:19 EDT Ventricular Rate:  80 PR Interval:    QRS Duration: 95 QT Interval:  368 QTC Calculation: 425 R Axis:   59 Text Interpretation:  Sinus rhythm Confirmed by Asencion Loveday MD, Holland Kotter ([id number])  on 12/26/2015 7:00:49 PM      MDM  PT IS FEELING BETTER.  SX ARE LIKELY FROM HER STOMACH.  LOW RISK FOR CAD, BUT EKG AND TROP NEG.  PT KNOWS TO RETURN IF WORSE. Final diagnoses:  Atypical chest pain     Jacalyn LefevreJulie Lacharles Altschuler, MD 12/26/15 2027

## 2015-12-26 NOTE — ED Notes (Signed)
Chest painonset yesterfay  No vomiting or radiation.  Hurts more sitting

## 2015-12-26 NOTE — ED Notes (Signed)
Pt verbalizes understanding of d/c instructions and denies any further needs at this time. 

## 2017-04-24 IMAGING — DX DG CHEST 2V
2 series · 2 of 2 positions shown · non-contrast
Comparison: December 07, 2012

CLINICAL DATA: Left-sided chest pain and shortness of breath.

EXAM:
CHEST  2 VIEW

[chest pa]
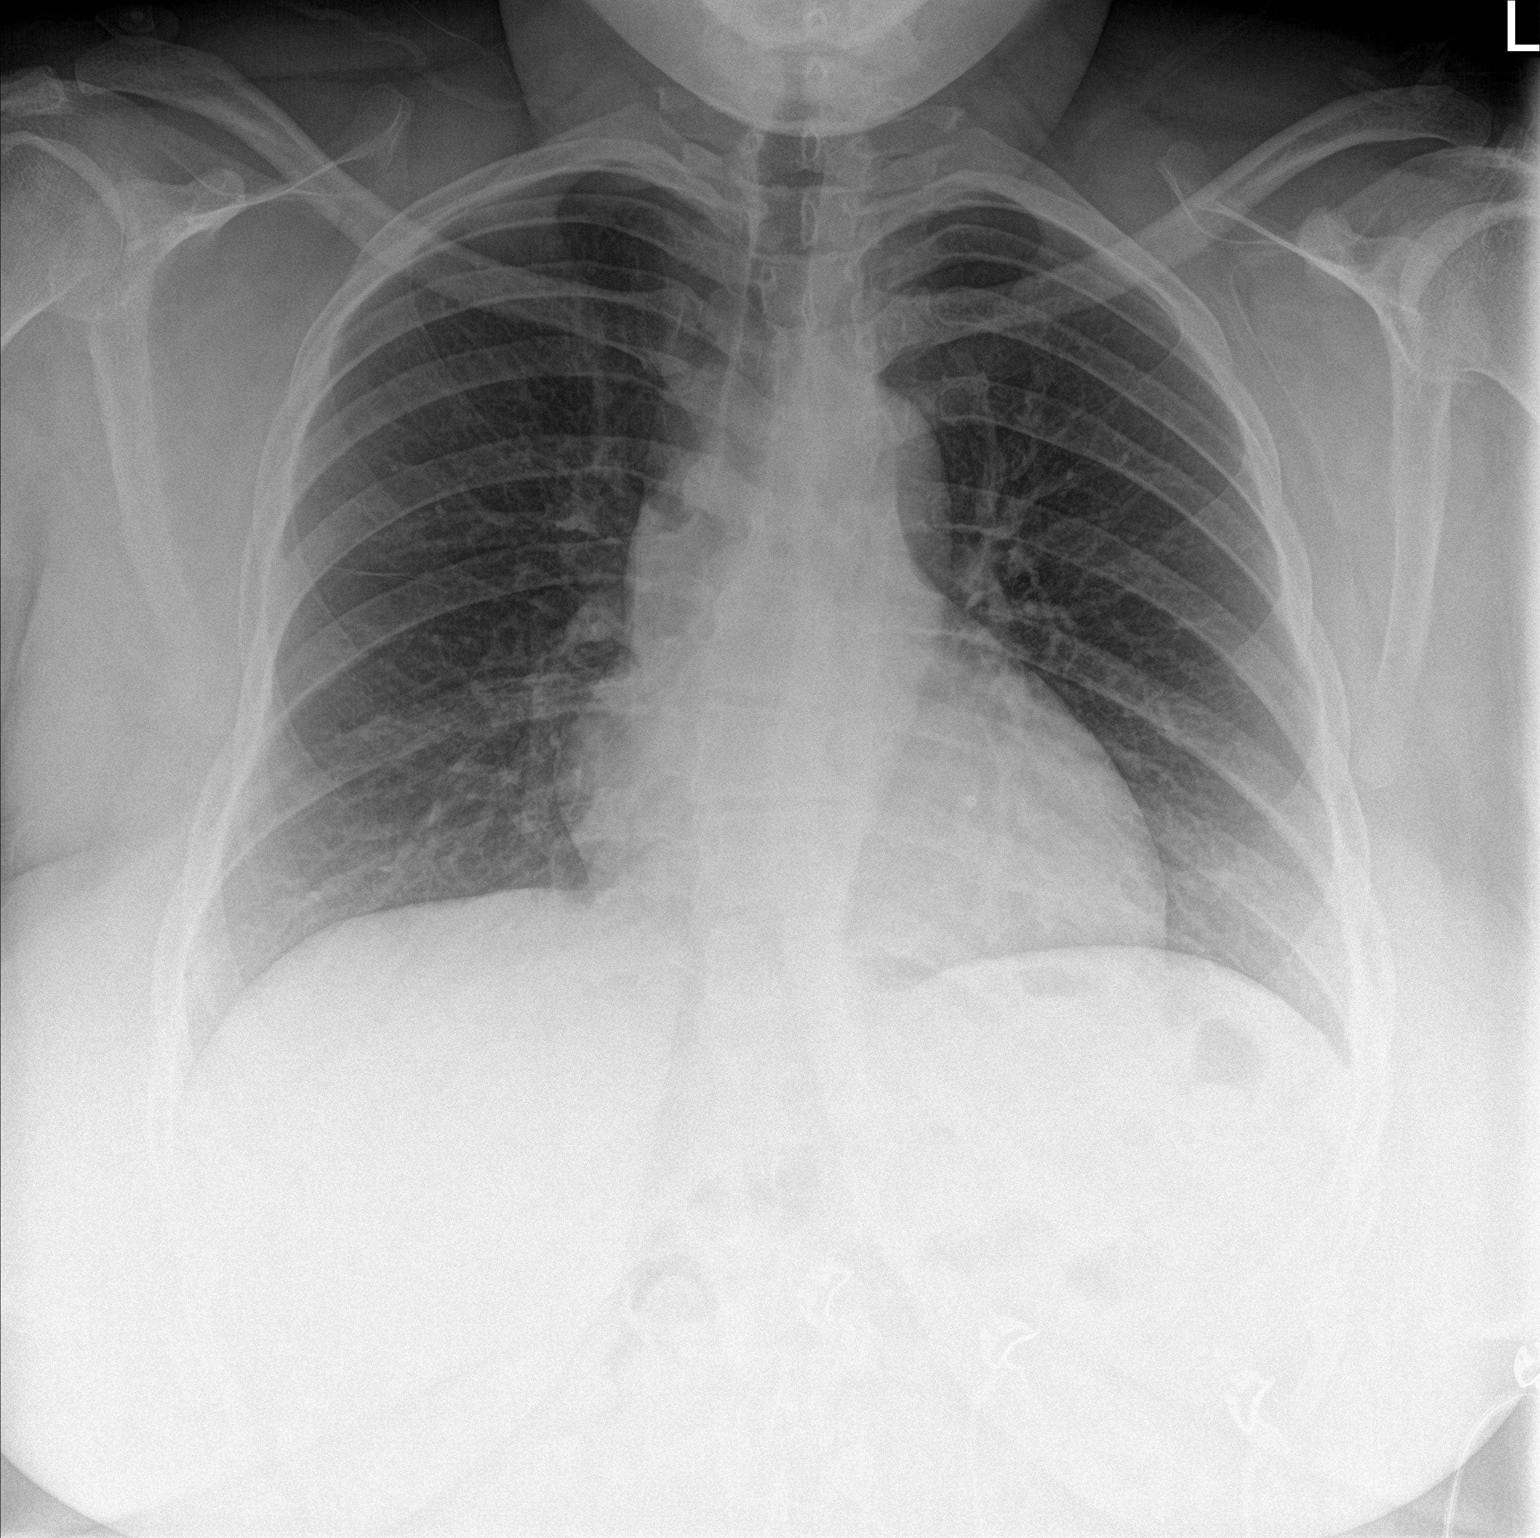

[chest lat]
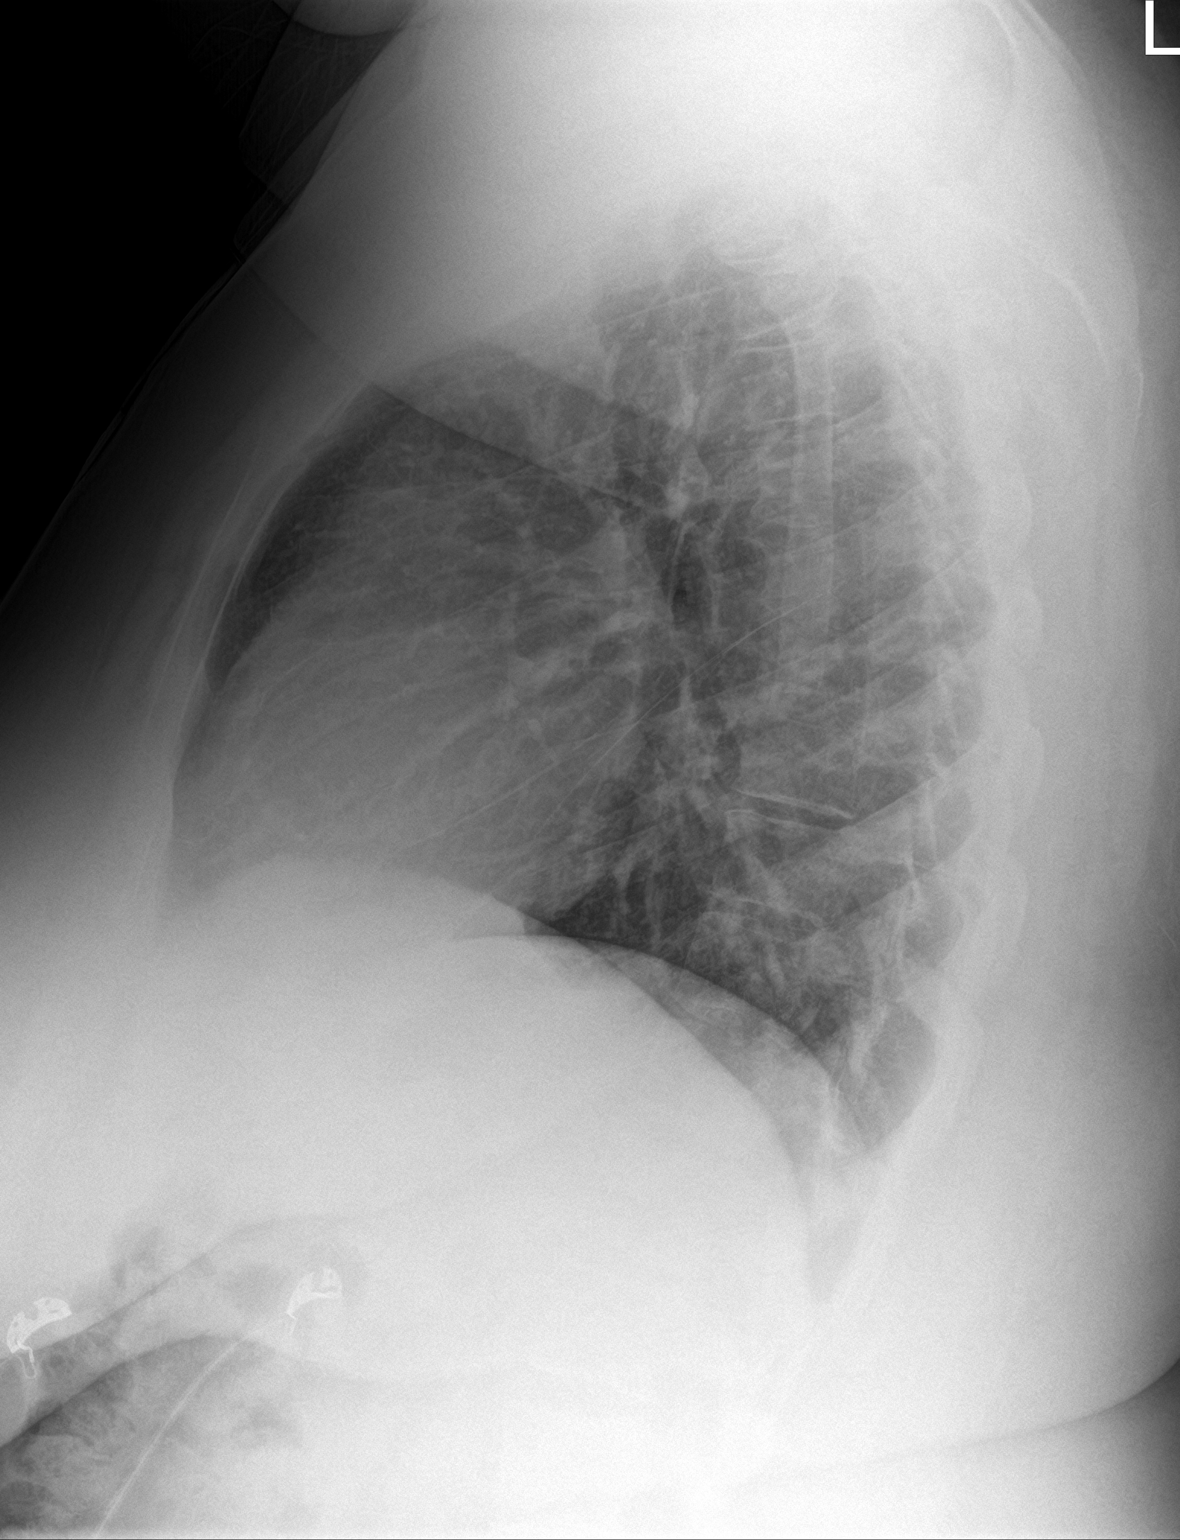

[2 of 2 positions shown; findings below may reference images not displayed]

FINDINGS: The heart size is borderline. The hila, mediastinum, lungs, and
pleura are otherwise unchanged and unremarkable.
IMPRESSION: No active cardiopulmonary disease.

## 2019-02-23 ENCOUNTER — Other Ambulatory Visit: Payer: Self-pay

## 2019-02-23 DIAGNOSIS — Z20822 Contact with and (suspected) exposure to covid-19: Secondary | ICD-10-CM

## 2019-02-25 LAB — NOVEL CORONAVIRUS, NAA: SARS-CoV-2, NAA: DETECTED — AB

## 2019-02-25 LAB — SPECIMEN STATUS REPORT

## 2023-11-25 ENCOUNTER — Encounter (HOSPITAL_BASED_OUTPATIENT_CLINIC_OR_DEPARTMENT_OTHER): Payer: Self-pay | Admitting: Emergency Medicine

## 2023-11-25 ENCOUNTER — Other Ambulatory Visit: Payer: Self-pay

## 2023-11-25 ENCOUNTER — Emergency Department (HOSPITAL_BASED_OUTPATIENT_CLINIC_OR_DEPARTMENT_OTHER)
Admission: EM | Admit: 2023-11-25 | Discharge: 2023-11-25 | Disposition: A | Discharge status: Home / Self Care | Attending: Emergency Medicine | Admitting: Emergency Medicine

## 2023-11-25 DIAGNOSIS — R197 Diarrhea, unspecified: Secondary | ICD-10-CM | POA: Insufficient documentation

## 2023-11-25 DIAGNOSIS — R110 Nausea: Secondary | ICD-10-CM | POA: Insufficient documentation

## 2023-11-25 DIAGNOSIS — R1084 Generalized abdominal pain: Secondary | ICD-10-CM

## 2023-11-25 LAB — URINALYSIS, ROUTINE W REFLEX MICROSCOPIC
Bilirubin Urine: NEGATIVE
Glucose, UA: NEGATIVE mg/dL
Hgb urine dipstick: NEGATIVE
Ketones, ur: NEGATIVE mg/dL
Leukocytes,Ua: NEGATIVE
Nitrite: NEGATIVE
Protein, ur: NEGATIVE mg/dL
Specific Gravity, Urine: 1.01 (ref 1.005–1.030)
pH: 7 (ref 5.0–8.0)

## 2023-11-25 LAB — COMPREHENSIVE METABOLIC PANEL WITH GFR
ALT: 19 U/L (ref 0–44)
AST: 19 U/L (ref 15–41)
Albumin: 4 g/dL (ref 3.5–5.0)
Alkaline Phosphatase: 81 U/L (ref 38–126)
Anion gap: 10 (ref 5–15)
BUN: 8 mg/dL (ref 6–20)
CO2: 22 mmol/L (ref 22–32)
Calcium: 9 mg/dL (ref 8.9–10.3)
Chloride: 107 mmol/L (ref 98–111)
Creatinine, Ser: 0.7 mg/dL (ref 0.44–1.00)
GFR, Estimated: >60 mL/min (ref >60)
Glucose, Bld: 91 mg/dL (ref 70–99)
Potassium: 4.2 mmol/L (ref 3.5–5.1)
Sodium: 139 mmol/L (ref 135–145)
Total Bilirubin: 0.2 mg/dL (ref 0.0–1.2)
Total Protein: 6.8 g/dL (ref 6.5–8.1)

## 2023-11-25 LAB — CBC
HCT: 32.7 % — ABNORMAL LOW (ref 36.0–46.0)
Hemoglobin: 10.4 g/dL — ABNORMAL LOW (ref 12.0–15.0)
MCH: 24.4 pg — ABNORMAL LOW (ref 26.0–34.0)
MCHC: 31.8 g/dL (ref 30.0–36.0)
MCV: 76.6 fL — ABNORMAL LOW (ref 80.0–100.0)
Platelets: 377 10*3/uL (ref 150–400)
RBC: 4.27 MIL/uL (ref 3.87–5.11)
RDW: 15.2 % (ref 11.5–15.5)
WBC: 5.3 10*3/uL (ref 4.0–10.5)
nRBC: 0 % (ref 0.0–0.2)

## 2023-11-25 LAB — PREGNANCY, URINE: Preg Test, Ur: NEGATIVE

## 2023-11-25 LAB — LIPASE, BLOOD: Lipase: 31 U/L (ref 11–51)

## 2023-11-25 MED ORDER — ALUM & MAG HYDROXIDE-SIMETH 200-200-20 MG/5ML PO SUSP
30.0000 mL | Freq: Once | ORAL | Status: AC
  Administered 2023-11-25: 30 mL via ORAL
  Filled 2023-11-25: qty 30

## 2023-11-25 NOTE — ED Provider Notes (Signed)
 [place] [facility] Provider Note   CSN: [id number] Arrival date & time: 11/25/23  1303     History  Chief Complaint  Patient presents with   Abdominal Pain    Belinda Marks is a 38 y.o. female.  38 yo F with a chief of abdominal pain.  Off and on for the past few weeks.  Has had symptoms off and on actually for a little while.  Takes omeprazole.  Had seen her family doctor somewhat recently who told her that she needed to take it every day.  Has been compliant since then as best she can.  She feels like it is worse at night and first thing in the morning.  No fevers no vomiting.  Nauseated at times.  She feels like she has had quite a bit of acid reflux.  Tried some Pepcid  on top of her omeprazole and some Tums with some improvement.  Has had some loose stools over the past couple days.  Pain is diffuse about the abdomen with some times having it worse in the epigastrium.   Abdominal Pain      Home Medications Prior to Admission medications   Medication Sig Start Date End Date Taking? Authorizing Provider  aspirin-acetaminophen -caffeine (EXCEDRIN MIGRAINE) 250-250-65 MG per tablet Take 2 tablets by mouth once as needed. For headache    [provider]  famotidine  (PEPCID ) 20 MG tablet Take 1 tablet (20 mg total) by mouth 2 (two) times daily. 12/26/15   Sueellen Emery, MD  HYDROcodone -acetaminophen  (NORCO/VICODIN) 5-325 MG per tablet Take 1-2 tablets by mouth every 6 (six) hours as needed. 11/22/14   Alisa Irish, PA-C  LOSARTAN POTASSIUM PO Take by mouth.    [provider]  Multiple Vitamin (MULITIVITAMIN WITH MINERALS) TABS Take 2 tablets by mouth daily.    [provider]  OVER THE COUNTER MEDICATION Take 1 tablet by mouth 3 (three) times daily. 150 mg raspberry ketone supplement    [provider]      Allergies    Acetaminophen  and Hydrochlorothiazide    Review of Systems   Review of Systems   Gastrointestinal:  Positive for abdominal pain.    Physical Exam Updated Vital Signs BP (!) 127/94 (BP Location: Left Arm)   Pulse 80   Temp 98.1 F (36.7 C) (Oral)   Resp 18   Wt 128.8 kg   LMP 11/09/2023   SpO2 100%   BMI 44.48 kg/m  Physical Exam Vitals and nursing note reviewed.  Constitutional:      General: She is not in acute distress.    Appearance: She is well-developed. She is not diaphoretic.  HENT:     Head: Normocephalic and atraumatic.  Eyes:     Pupils: Pupils are equal, round, and reactive to light.  Cardiovascular:     Rate and Rhythm: Normal rate and regular rhythm.     Heart sounds: No murmur heard.    No friction rub. No gallop.  Pulmonary:     Effort: Pulmonary effort is normal.     Breath sounds: No wheezing or rales.  Abdominal:     General: There is no distension.     Palpations: Abdomen is soft.     Tenderness: There is no abdominal tenderness.     Comments: No obvious pain on palpation of the abdomen.  Negative Murphy sign.  Musculoskeletal:        General: No tenderness.     Cervical back: Normal range  of motion and neck supple.  Skin:    General: Skin is warm and dry.  Neurological:     Mental Status: She is alert and oriented to person, place, and time.  Psychiatric:        Behavior: Behavior normal.     ED Results / Procedures / Treatments   Labs (all labs ordered are listed, but only abnormal results are displayed) Labs Reviewed  CBC - Abnormal; Notable for the following components:      Result Value   Hemoglobin 10.4 (*)    HCT 32.7 (*)    MCV 76.6 (*)    MCH 24.4 (*)    All other components within normal limits  URINALYSIS, ROUTINE W REFLEX MICROSCOPIC - Abnormal; Notable for the following components:   Color, Urine STRAW (*)    All other components within normal limits  LIPASE, BLOOD  COMPREHENSIVE METABOLIC PANEL WITH GFR  PREGNANCY, URINE    EKG None  Radiology No results found.  Procedures Procedures     Medications Ordered in ED Medications  alum & mag hydroxide-simeth (MAALOX/MYLANTA) 200-200-20 MG/5ML suspension 30 mL (30 mLs Oral Given 11/25/23 1400)    ED Course/ Medical Decision Making/ A&P                                 Medical Decision Making Amount and/or Complexity of Data Reviewed Labs: ordered.  Risk OTC drugs.   38 yo F with a chief complaints of abdominal pain.  This been going on for about a week.  Pain hurts off and on.  Some nausea but no vomiting.  She thinks it could be reflux disease.  She has been taking her omeprazole but without significant improvement.  She has a benign abdominal exam for me.  She appears well-hydrated.  Will check blood work.  No leukocytosis, no anemia, no LFT elevation.  Lipase is normal.  UA negative for infection.  Pregnancy test negative.  Patient is feeling a bit better after a GI cocktail.  Will discharge home.  PCP follow-up.  3:02 PM:  I have discussed the diagnosis/risks/treatment options with the patient and family.  Evaluation and diagnostic testing in the emergency department does not suggest an emergent condition requiring admission or immediate intervention beyond what has been performed at this time.  They will follow up with PCP, GI. We also discussed returning to the ED immediately if new or worsening sx occur. We discussed the sx which are most concerning (e.g., sudden worsening pain, fever, inability to tolerate by mouth) that necessitate immediate return. Medications administered to the patient during their visit and any new prescriptions provided to the patient are listed below.  Medications given during this visit Medications  alum & mag hydroxide-simeth (MAALOX/MYLANTA) 200-200-20 MG/5ML suspension 30 mL (30 mLs Oral Given 11/25/23 1400)     The patient appears reasonably screen and/or stabilized for discharge and I doubt any other medical condition or other [facility] requiring further screening, evaluation, or  treatment in the ED at this time prior to discharge.          Final Clinical Impression(s) / ED Diagnoses Final diagnoses:  Generalized abdominal pain    Rx / DC Orders ED Discharge Orders     None         Albertus Hughs, DO 11/25/23 1502

## 2023-11-25 NOTE — ED Triage Notes (Signed)
 Gl abd pain x 1 week , nausea , took laxative , acid reflux meds with no relief . Feels bloated .

## 2023-11-25 NOTE — Discharge Instructions (Signed)
 Try pepcid  or tagamet up to twice a day.  Try to avoid things that may make this worse, most commonly these are spicy foods tomato based products fatty foods chocolate and peppermint.  Alcohol and tobacco can also make this worse.  Return to the emergency department for sudden worsening pain fever or inability to eat or drink. Try to not eat or drink anything including water 4 hours before you go to bed.
